# Patient Record
Sex: Female | Born: 1975 | Race: White | Hispanic: No | Marital: Married | Smoking: Current every day smoker
Health system: Southern US, Community
[De-identification: ages and names within clinical notes are randomized; demographics above are authoritative.]

## PROBLEM LIST (undated history)

## (undated) DIAGNOSIS — I10 Essential (primary) hypertension: Secondary | ICD-10-CM

## (undated) DIAGNOSIS — F319 Bipolar disorder, unspecified: Secondary | ICD-10-CM

## (undated) DIAGNOSIS — F32A Depression, unspecified: Secondary | ICD-10-CM

## (undated) DIAGNOSIS — K219 Gastro-esophageal reflux disease without esophagitis: Secondary | ICD-10-CM

## (undated) DIAGNOSIS — F329 Major depressive disorder, single episode, unspecified: Secondary | ICD-10-CM

## (undated) DIAGNOSIS — D759 Disease of blood and blood-forming organs, unspecified: Secondary | ICD-10-CM

## (undated) DIAGNOSIS — J45909 Unspecified asthma, uncomplicated: Secondary | ICD-10-CM

## (undated) HISTORY — PX: TUBAL LIGATION: SHX77

## (undated) HISTORY — PX: KNEE ARTHROSCOPY: SUR90

---

## 2006-06-03 ENCOUNTER — Other Ambulatory Visit: Admission: RE | Admit: 2006-06-03 | Discharge: 2006-06-03 | Payer: Self-pay | Admitting: Obstetrics and Gynecology

## 2007-05-13 ENCOUNTER — Other Ambulatory Visit: Admission: RE | Admit: 2007-05-13 | Discharge: 2007-05-13 | Payer: Self-pay | Admitting: Obstetrics and Gynecology

## 2007-06-04 ENCOUNTER — Encounter: Admission: RE | Admit: 2007-06-04 | Discharge: 2007-06-04 | Payer: Self-pay | Admitting: Family Medicine

## 2008-02-22 ENCOUNTER — Emergency Department (HOSPITAL_COMMUNITY): Admission: EM | Admit: 2008-02-22 | Discharge: 2008-02-22 | Payer: Self-pay | Admitting: Emergency Medicine

## 2009-09-13 ENCOUNTER — Other Ambulatory Visit: Admission: RE | Admit: 2009-09-13 | Discharge: 2009-09-13 | Payer: Self-pay | Admitting: Obstetrics and Gynecology

## 2011-03-21 LAB — POCT URINALYSIS DIP (DEVICE)
Glucose, UA: NEGATIVE
Nitrite: NEGATIVE
pH: 7

## 2011-03-21 LAB — POCT PREGNANCY, URINE: Preg Test, Ur: NEGATIVE

## 2011-04-12 ENCOUNTER — Other Ambulatory Visit (HOSPITAL_COMMUNITY): Payer: Self-pay | Admitting: Family Medicine

## 2011-04-12 ENCOUNTER — Ambulatory Visit (HOSPITAL_COMMUNITY)
Admission: RE | Admit: 2011-04-12 | Discharge: 2011-04-12 | Disposition: A | Discharge status: Home / Self Care | Payer: BC Managed Care – PPO | Source: Ambulatory Visit | Attending: Family Medicine | Admitting: Family Medicine

## 2011-04-12 DIAGNOSIS — I700 Atherosclerosis of aorta: Secondary | ICD-10-CM | POA: Insufficient documentation

## 2011-04-12 DIAGNOSIS — I517 Cardiomegaly: Secondary | ICD-10-CM | POA: Insufficient documentation

## 2011-04-12 DIAGNOSIS — R160 Hepatomegaly, not elsewhere classified: Secondary | ICD-10-CM | POA: Insufficient documentation

## 2011-04-12 DIAGNOSIS — K7689 Other specified diseases of liver: Secondary | ICD-10-CM | POA: Insufficient documentation

## 2011-04-12 MED ORDER — IOHEXOL 300 MG/ML  SOLN
100.0000 mL | Freq: Once | INTRAMUSCULAR | Status: AC | PRN
  Administered 2011-04-12: 100 mL via INTRAVENOUS

## 2011-04-13 ENCOUNTER — Other Ambulatory Visit: Payer: Self-pay | Admitting: Family Medicine

## 2011-04-13 DIAGNOSIS — R1011 Right upper quadrant pain: Secondary | ICD-10-CM

## 2011-04-16 ENCOUNTER — Ambulatory Visit
Admission: RE | Admit: 2011-04-16 | Discharge: 2011-04-16 | Disposition: A | Discharge status: Home / Self Care | Payer: BC Managed Care – PPO | Source: Ambulatory Visit | Attending: Family Medicine | Admitting: Family Medicine

## 2011-04-16 DIAGNOSIS — R1011 Right upper quadrant pain: Secondary | ICD-10-CM

## 2011-05-02 ENCOUNTER — Other Ambulatory Visit (HOSPITAL_COMMUNITY)
Admission: RE | Admit: 2011-05-02 | Discharge: 2011-05-02 | Disposition: A | Discharge status: Home / Self Care | Payer: BC Managed Care – PPO | Source: Ambulatory Visit | Attending: Obstetrics and Gynecology | Admitting: Obstetrics and Gynecology

## 2011-05-02 ENCOUNTER — Other Ambulatory Visit: Payer: Self-pay | Admitting: Obstetrics and Gynecology

## 2011-05-02 DIAGNOSIS — Z01419 Encounter for gynecological examination (general) (routine) without abnormal findings: Secondary | ICD-10-CM | POA: Insufficient documentation

## 2012-02-04 ENCOUNTER — Encounter (HOSPITAL_COMMUNITY): Payer: Self-pay | Admitting: *Deleted

## 2012-02-04 ENCOUNTER — Emergency Department (HOSPITAL_COMMUNITY)
Admission: EM | Admit: 2012-02-04 | Discharge: 2012-02-04 | Disposition: A | Discharge status: Home / Self Care | Payer: BC Managed Care – PPO | Attending: Emergency Medicine | Admitting: Emergency Medicine

## 2012-02-04 DIAGNOSIS — F17200 Nicotine dependence, unspecified, uncomplicated: Secondary | ICD-10-CM | POA: Insufficient documentation

## 2012-02-04 DIAGNOSIS — R0981 Nasal congestion: Secondary | ICD-10-CM

## 2012-02-04 DIAGNOSIS — F319 Bipolar disorder, unspecified: Secondary | ICD-10-CM | POA: Insufficient documentation

## 2012-02-04 DIAGNOSIS — I10 Essential (primary) hypertension: Secondary | ICD-10-CM | POA: Insufficient documentation

## 2012-02-04 DIAGNOSIS — J3489 Other specified disorders of nose and nasal sinuses: Secondary | ICD-10-CM | POA: Insufficient documentation

## 2012-02-04 HISTORY — DX: Essential (primary) hypertension: I10

## 2012-02-04 HISTORY — DX: Depression, unspecified: F32.A

## 2012-02-04 HISTORY — DX: Major depressive disorder, single episode, unspecified: F32.9

## 2012-02-04 HISTORY — DX: Bipolar disorder, unspecified: F31.9

## 2012-02-04 MED ORDER — AMOXICILLIN-POT CLAVULANATE 875-125 MG PO TABS
1.0000 | ORAL_TABLET | Freq: Once | ORAL | Status: AC
  Administered 2012-02-04: 1 via ORAL
  Filled 2012-02-04: qty 1

## 2012-02-04 MED ORDER — AMOXICILLIN-POT CLAVULANATE 875-125 MG PO TABS: 1.0000 | ORAL_TABLET | Freq: Two times a day (BID) | ORAL | Status: AC

## 2012-02-04 MED ORDER — HYDROCODONE-ACETAMINOPHEN 5-500 MG PO TABS: 1.0000 | ORAL_TABLET | Freq: Four times a day (QID) | ORAL | Status: AC | PRN

## 2012-02-04 MED ORDER — PSEUDOEPHEDRINE HCL ER 120 MG PO TB12: 120.0000 mg | ORAL_TABLET | Freq: Two times a day (BID) | ORAL | Status: DC

## 2012-02-04 MED ORDER — HYDROMORPHONE HCL PF 1 MG/ML IJ SOLN
1.0000 mg | Freq: Once | INTRAMUSCULAR | Status: AC
  Administered 2012-02-04: 1 mg via INTRAMUSCULAR
  Filled 2012-02-04: qty 1

## 2012-02-04 MED ORDER — PSEUDOEPHEDRINE HCL ER 120 MG PO TB12
120.0000 mg | ORAL_TABLET | Freq: Two times a day (BID) | ORAL | Status: DC
  Administered 2012-02-04: 120 mg via ORAL
  Filled 2012-02-04: qty 1

## 2012-02-04 MED ORDER — ONDANSETRON 4 MG PO TBDP
4.0000 mg | ORAL_TABLET | Freq: Once | ORAL | Status: AC
  Administered 2012-02-04: 4 mg via ORAL
  Filled 2012-02-04: qty 1

## 2012-02-04 NOTE — ED Notes (Signed)
Patient presents with c/o headache.  She started with a headache approx 4 days ago and thought it was a sinus headache.  She took Nyquil, Motrin, Mucinex but it seems to have gotten worse.  States the top of her head hurts as well as her ears and cheeks.  Is not moving her head not due to her neck being stiff but her head hurts so bad.  Has never had a headache like this one.  Sitting in the chair, husband was answering most of the questions.

## 2012-02-04 NOTE — ED Notes (Signed)
Patient is resting comfortably; no respiratory or acute distress noted.  Patient states that pain has improved; states, "I feel much better".  Updated patient on plan of care; informed patient that we are currently waiting on discharge paperwork from EDP/FNP.  Patient has no other questions or concerns at this time; will continue to monitor.

## 2012-02-04 NOTE — ED Provider Notes (Signed)
History     CSN: [id number]  Arrival date & time 02/04/12  0310   First MD Initiated Contact with Patient 02/04/12 [phone]      Chief Complaint  Patient presents with  . Headache    (Consider location/radiation/quality/duration/timing/severity/associated sxs/prior treatment) HPI Comments: Patient has had frontal headache with congestion for the last 3, days.  She's tried multiple over-the-counter medications without any relief.  She has a history of sinus infections.  She has not seen her primary care doctor for this episode  Patient is a 36 y.o. female presenting with headaches. The history is provided by the patient.  Headache  This is a new problem. The problem occurs constantly. The problem has been gradually worsening. Pertinent negatives include no fever and no nausea.    Past Medical History  Diagnosis Date  . Hypertension   . Depression   . Bipolar 1 disorder     Past Surgical History  Procedure Date  . Cesarean section     History reviewed. No pertinent family history.  History  Substance Use Topics  . Smoking status: Current Everyday Smoker  . Smokeless tobacco: Not on file  . Alcohol Use: Yes    OB History    Grav Para Term Preterm Abortions TAB SAB Ect Mult Living                  Review of Systems  Constitutional: Negative for fever and chills.  HENT: Positive for sinus pressure. Negative for congestion, sore throat, neck stiffness, dental problem and ear discharge.   Gastrointestinal: Negative for nausea.  Musculoskeletal: Negative for myalgias and joint swelling.  Skin: Negative for wound.  Neurological: Positive for headaches. Negative for dizziness and weakness.    Allergies  Review of patient's allergies indicates no known allergies.  Home Medications   Current Outpatient Rx  Name Route Sig Dispense Refill  . AMPHETAMINE-DEXTROAMPHETAMINE 20 MG PO TABS Oral Take 20 mg by mouth 3 (three) times daily.    . ATENOLOL-CHLORTHALIDONE 50-25 MG  PO TABS Oral Take 1 tablet by mouth daily.    . DESVENLAFAXINE SUCCINATE ER 50 MG PO TB24 Oral Take 50 mg by mouth daily.    Marland Kitchen ESOMEPRAZOLE MAGNESIUM 40 MG PO CPDR Oral Take 40 mg by mouth daily before breakfast.    . IBUPROFEN 200 MG PO TABS Oral Take 600 mg by mouth every 6 (six) hours as needed. For pain    . NYQUIL PO Oral Take 2 capsules by mouth at bedtime as needed. For pain    . ZIPRASIDONE HCL 40 MG PO CAPS Oral Take 40 mg by mouth daily.    . AMOXICILLIN-POT CLAVULANATE 875-125 MG PO TABS Oral Take 1 tablet by mouth 2 (two) times daily. 20 tablet 0  . HYDROCODONE-ACETAMINOPHEN 5-500 MG PO TABS Oral Take 1-2 tablets by mouth every 6 (six) hours as needed for pain. 15 tablet 0  . PSEUDOEPHEDRINE HCL ER 120 MG PO TB12 Oral Take 1 tablet (120 mg total) by mouth every 12 (twelve) hours. 20 tablet 0    BP 130/85  Pulse 79  Temp 98.2 F (36.8 C) (Oral)  Resp 16  SpO2 98%  Physical Exam  Constitutional: She is oriented to person, place, and time. She appears well-developed and well-nourished.  HENT:  Head: Normocephalic.  Eyes: Pupils are equal, round, and reactive to light. Right eye exhibits normal extraocular motion and no nystagmus. Left eye exhibits normal extraocular motion and no nystagmus. Right pupil is round and  reactive. Left pupil is round and reactive. Pupils are equal.  Cardiovascular: Normal rate.   Pulmonary/Chest: Effort normal.  Abdominal: Soft.  Musculoskeletal: Normal range of motion.  Neurological: She is alert and oriented to person, place, and time.  Skin: Skin is warm and dry.    ED Course  Procedures (including critical care time)  Labs Reviewed - No data to display No results found.   1. Sinus congestion       MDM  Patient was given IM, Dilaudid for pain, as well as pseudoephedrine , and Augmentin.  Patient is feeling, better, since she's been medicated.  I will send her home with prescription for pseudoephedrine as well as Augmentin and  short-term use of, and Percocet        Arman Filter, NP 02/04/12 0557  Arman Filter, NP 02/04/12 [phone]

## 2012-02-04 NOTE — ED Notes (Signed)
Patient given discharge paperwork; went over discharge instructions with patient.  Patient instructed to follow up with referral/primary care physician, take prescriptions as directed, to finish entire antibiotic prescription, to not drive/drink while taking narcotics, and to return to the ED for new, worsening, or concerning symptoms.

## 2012-02-04 NOTE — ED Provider Notes (Signed)
Medical screening examination/treatment/procedure(s) were performed by non-physician practitioner and as supervising physician I was immediately available for consultation/collaboration.  Vikki Gains Smitty Cords, MD 02/04/12 0600

## 2012-02-04 NOTE — ED Notes (Signed)
Received bedside report from [place], [place].  Patient currently resting quietly in bed; no respiratory or acute distress noted.  Patient updated on plan of care; informed patient that we are currently waiting on further orders from EDP/FNP.  Patient has no other questions or concerns at this time.  Will continue to monitor.

## 2012-02-04 NOTE — ED Notes (Signed)
Pt states she feels like she has a sinus infection. Pt states pressure HA, denies hx of migraines. Pt neuro deficits. Pt ambulatory. Pt states generalize HA

## 2012-06-19 ENCOUNTER — Other Ambulatory Visit (HOSPITAL_COMMUNITY)
Admission: RE | Admit: 2012-06-19 | Discharge: 2012-06-19 | Disposition: A | Discharge status: Home / Self Care | Payer: BC Managed Care – PPO | Source: Ambulatory Visit | Attending: Obstetrics and Gynecology | Admitting: Obstetrics and Gynecology

## 2012-06-19 ENCOUNTER — Other Ambulatory Visit: Payer: Self-pay | Admitting: Obstetrics and Gynecology

## 2012-06-19 DIAGNOSIS — Z1151 Encounter for screening for human papillomavirus (HPV): Secondary | ICD-10-CM | POA: Insufficient documentation

## 2012-06-19 DIAGNOSIS — Z01419 Encounter for gynecological examination (general) (routine) without abnormal findings: Secondary | ICD-10-CM | POA: Insufficient documentation

## 2012-07-21 ENCOUNTER — Other Ambulatory Visit: Payer: Self-pay | Admitting: Obstetrics and Gynecology

## 2012-08-27 ENCOUNTER — Other Ambulatory Visit: Payer: Self-pay | Admitting: Obstetrics and Gynecology

## 2012-09-02 ENCOUNTER — Encounter (HOSPITAL_COMMUNITY): Payer: Self-pay | Admitting: Pharmacist

## 2012-09-07 ENCOUNTER — Other Ambulatory Visit: Payer: Self-pay | Admitting: Obstetrics and Gynecology

## 2012-09-07 NOTE — H&P (Signed)
08/18/2012  History of Present Illness  General:  37 y/o G6P4 with irregular bleeding presents to discuss future management, possible surgery. S/p pelvic ultrasound, which showed a small submucosal fibroid. EMS thickened. EMB benign. Declines medical management. Declines Mirena. Desires surgical intervention. Does have time at work for an extended recovery. Would like to do it sooner than later. Does not want to have a hysterectomy at this time due to possibility of extended recovery. Recent UTI, symptoms resolved s/p antibiotics. Occ Right groin pain when starting to stand up from a seated position.   Current Medications  Taking ProAir HFA 90MC[employer] Aerosol Solution INHALE 1 TO 2 PUFFS EVERY 4 HOURS AS NEEDED FOR WHEEZING as needed  Taking Multivitamins Tablet 1 tablet once a day  Taking Vitamin B-6 ? Tablet 1 tablet Once a day  Taking Vitamin B-12 CR ? Tablet Extended Release 1 tablet Once a day  Taking Fish Oil 1000 MG Capsule 1 capsule with a meal Once a day  Taking Flax Seed Oil 1000 MG Capsule 1 capsule once a day  Taking Claritin 10 MG Tablet 1 tablet as needed  Taking Atenolol-Chlorthalidone 50/25MG Tablet TAKE 1 TABLET DAILY FOR BLOOD PRESSURE   Taking Nexium 40MG Capsule Delayed Release 1 capsule Once a day  Taking Adderall 20 MG Tablet 1 tablet Once a day  Taking Seroquel 400 MG Tablet 1 tablet at bedtime Once a day  Taking Vitamin D   Taking Cipro 250 MG Tablet 1 tablet Twice a day  Taking Latuda 20 MG Tablet 1 tablet with food Once a day  Medication List reviewed and reconciled with the patient   Past Medical History  Hypertension (2011)  Asthma  GERD  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 Kaur, 12/2011)   Surgical History  Knee surgery   C section   Tubal ligation   Cervical cone due to dysplasia    Family History  Father: COPD  Mother: HTN, GERD,  PUD, valvular heart disease, Breast cancer, endometrial cancer 2013  Paternal Grand Mother: Diabetes  Maternal Grand Father: Colon cancer  Paternal aunt: Lung cancer, hemochromatosis  3 cousins with hemachromatosis, great paternal aunt breast cancer, paternal aunt aneurysm Neg GI family hx of polyps and liver diease.   Social History  General:  History of smoking  cigarettes: Current smoker Frequency: 1 PPD Smoking: yes, 1 PPD.  Alcohol: yes, occasionally wine.  Caffeine: coffee, ,un sweet tea.  no Recreational drug use.  Occupation: [employer].  Marital Status: married, Scott.  Children: 4.  Seat belt use: yes.    Gyn History  Sexual activity currently sexually active.  Periods : since October every 2 weeks.  LMP 08/01/12.  Birth control BTL.  Last pap smear date 06/19/12, all negative.  Denies H/O Last mammogram date.  Abnormal pap smear treated with LEEP.  Denies H/O STD.    OB History  Number of pregnancies 6.  Pregnancy # 1 live birth, vaginal delivery.  Pregnancy # 2 live birth, vaginal delivery.  Pregnancy # 3 abortion.  Pregnancy # 4: live birth, C-section.  Pregnancy # 5: Abortion.  Pregnancy # 6 live birth, C-section.    Allergies  Metformin HCl: GI side effects  pollen   Hospitalization/Major Diagnostic Procedure  see surgery   Not in the past year 05/2011   Vital Signs  Wt 214, Wt change 3 lb, Ht 65.5, BMI   35.07, Temp 97.6, Pulse sitting 83, BP sitting 117/78.   Physical Examination  GENERAL:  Patient appears in NAD, pleasant.  Build: well developed.  General Appearance: overweight.  Race: caucasian.  NECK:  Cervical lymph nodes: unremarkable, no lymphadenopathy.  ROM: normal.  Thyroid: no thyromegaly, non tender.  LUNGS:  Breath sounds: clear to auscultation.  Dyspnea: no.  HEART:  Murmurs: none.  Rate: normal.  Rhythm: regular.  ABDOMEN:  General: no masses,tenderness,organomegaly, no CVAT.  FEMALE GENITOURINARY:   Adnexa: no mass, non tender.  Anus/perineum: normal, no lesions.  Cervix/ cuff: normal appearance , no lesions/discharge/bleeding, good pelvic support.  External genitalia: normal, no lesions, no skin discoloration, no lymphadenopathy.  Rectum: deferred.  Urethra: normal external meatus.  Uterus: normal size/shape/consistency, freely mobile, non tender.  Vagina: pink/moist mucosa, no lesions, no abnormal discharge, odorless.  Vulva: normal, no lesions, no skin discoloration, non tender.  EXTREMITIES:  Extremities FROM of all extremities.  NEUROLOGICAL:  Gait: normal.  Orientation: alert and oriented x 3.     Assessments   1. Irregular menses - 626.4 (Primary)   2. 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 Irregular menses  Notes: Reviewed in detail the recommended surgical procedures-hysteroscopy, resection of fibroid with Trueclear and HTA endometrial ablation. Pt informed that bleeding may resume and that most definitive option is hysterectomy. R/B/A reviewed. All questions answered.  Referral To:[facility]necology Reason:Precert and schedule for HTA ablation, True Clear resection of fibroid    Follow Up  2 Weeks post op    

## 2012-09-11 ENCOUNTER — Encounter (HOSPITAL_COMMUNITY)
Admission: RE | Admit: 2012-09-11 | Discharge: 2012-09-11 | Disposition: A | Discharge status: Home / Self Care | Payer: BC Managed Care – PPO | Source: Ambulatory Visit | Attending: Obstetrics and Gynecology | Admitting: Obstetrics and Gynecology

## 2012-09-11 ENCOUNTER — Encounter (HOSPITAL_COMMUNITY): Payer: Self-pay

## 2012-09-11 HISTORY — DX: Unspecified asthma, uncomplicated: J45.909

## 2012-09-11 HISTORY — DX: Disease of blood and blood-forming organs, unspecified: D75.9

## 2012-09-11 HISTORY — DX: Gastro-esophageal reflux disease without esophagitis: K21.9

## 2012-09-11 LAB — BASIC METABOLIC PANEL
Calcium: 9.2 mg/dL (ref 8.4–10.5)
Creatinine, Ser: 0.59 mg/dL (ref 0.50–1.10)
GFR calc Af Amer: >90 mL/min (ref >90)
GFR calc non Af Amer: >90 mL/min (ref >90)
Sodium: 136 mEq/L (ref 135–145)

## 2012-09-11 LAB — CBC
Platelets: 295 10*3/uL (ref 150–400)
RBC: 4.65 MIL/uL (ref 3.87–5.11)
RDW: 13.9 % (ref 11.5–15.5)
WBC: 9.9 10*3/uL (ref 4.0–10.5)

## 2012-09-11 NOTE — Patient Instructions (Signed)
Your procedure is scheduled on:09/16/12  Enter through the Main Entrance at :6am Pick up desk phone and dial [phone] and inform us of your arrival.  Please call [phone] if you have any problems the morning of surgery.  Remember: Do not eat or drink after midnight:Monday   DO NOT wear jewelry, eye make-up, lipstick,body lotion, or dark fingernail polish.   If you are to be admitted after surgery, leave suitcase in car until your room has been assigned. Patients discharged on the day of surgery will not be allowed to drive home.

## 2012-09-12 IMAGING — US US ABDOMEN COMPLETE
1 series · 14 of 25 positions shown · non-contrast
Comparison: 04/12/2011

CLINICAL DATA: Right upper quadrant pain

ABDOMINAL ULTRASOUND COMPLETE

[Series 1: us abdomen complete · 0.22mm/px · 14 of 70 slices shown]
[im 1/70]
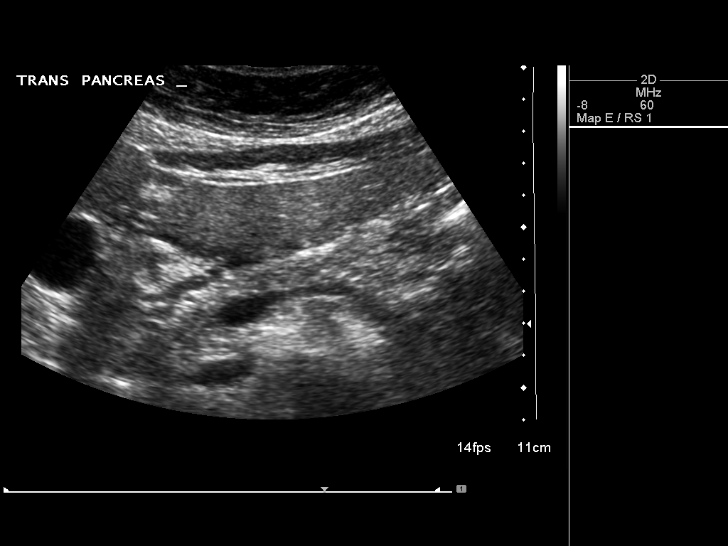
[im 6/70]
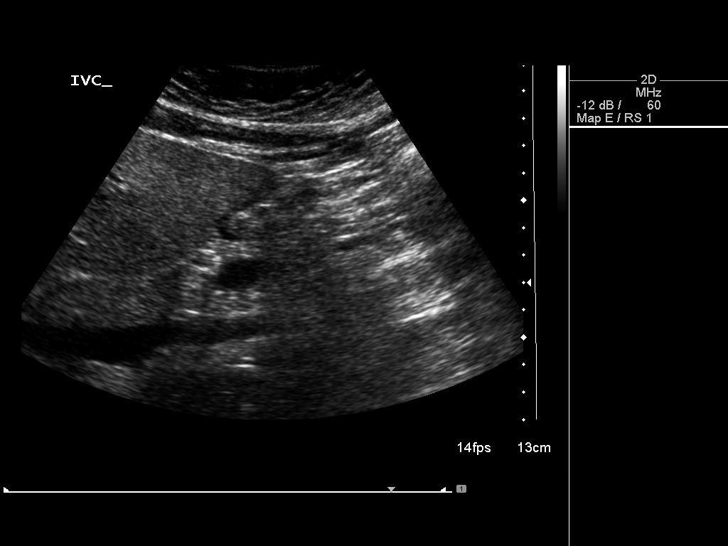
[im 12/70]
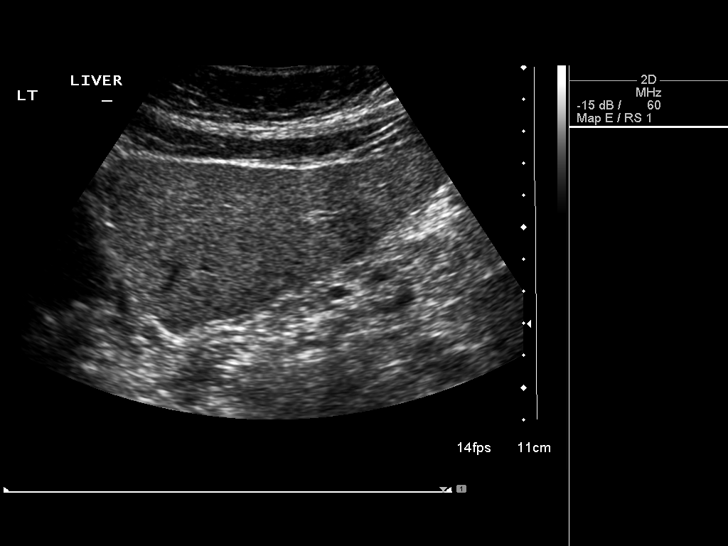
[im 18/70]
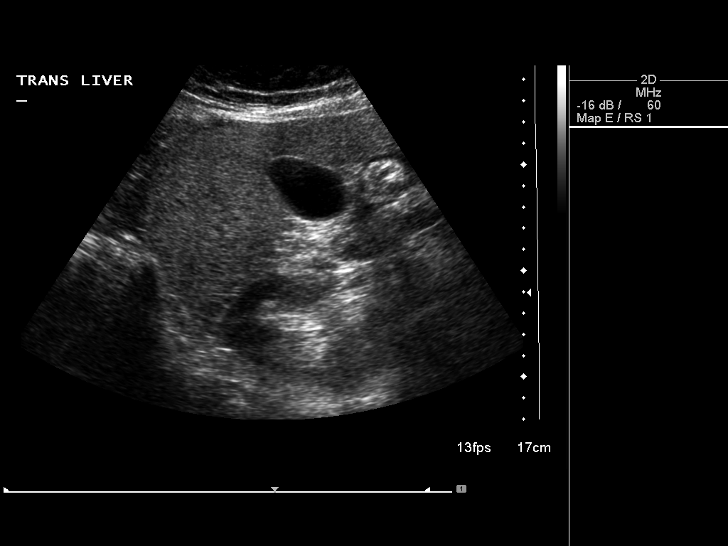
[im 24/70]
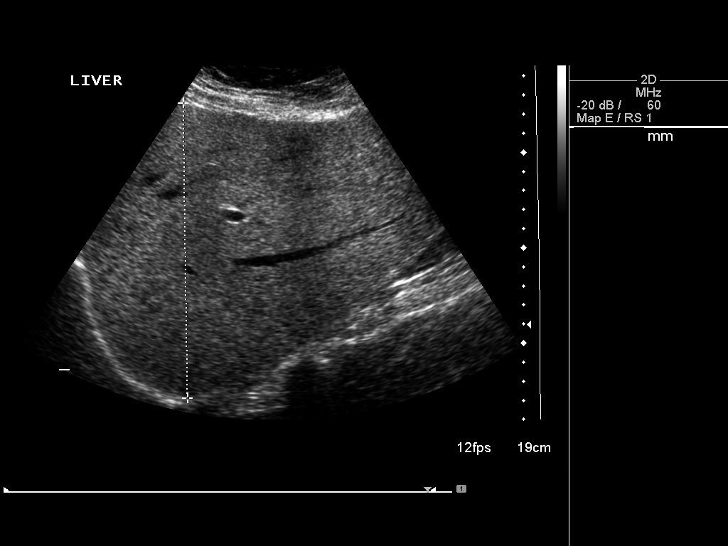
[im 26/70]
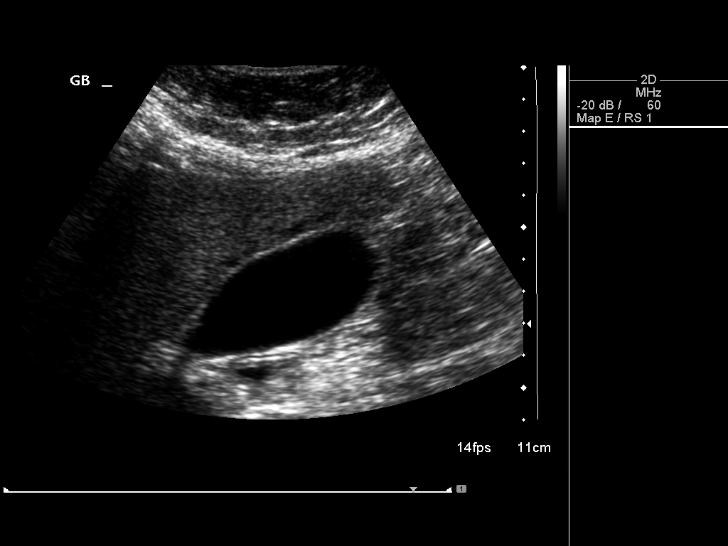
[im 32/70]
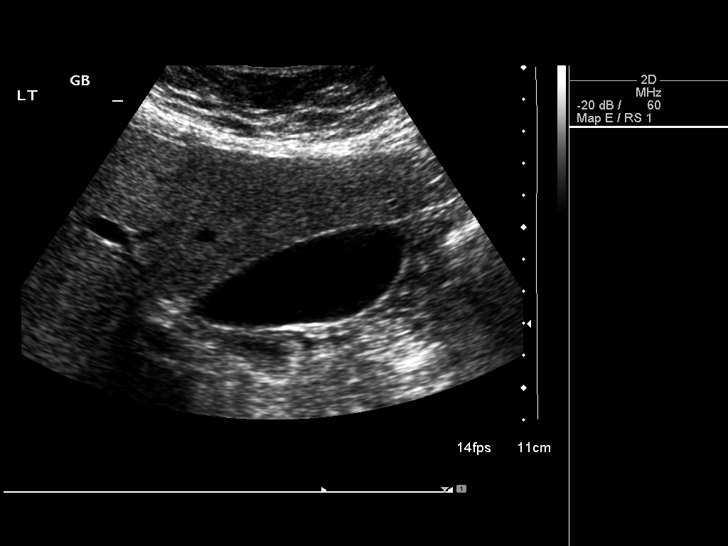
[im 38/70]
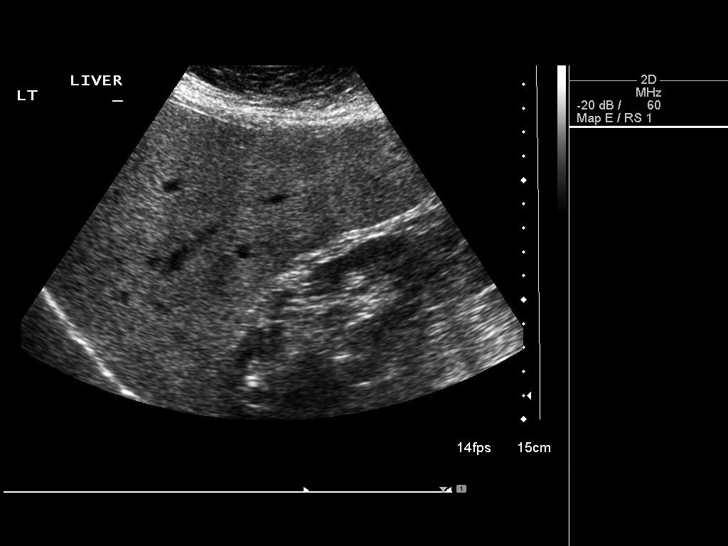
[im 44/70]
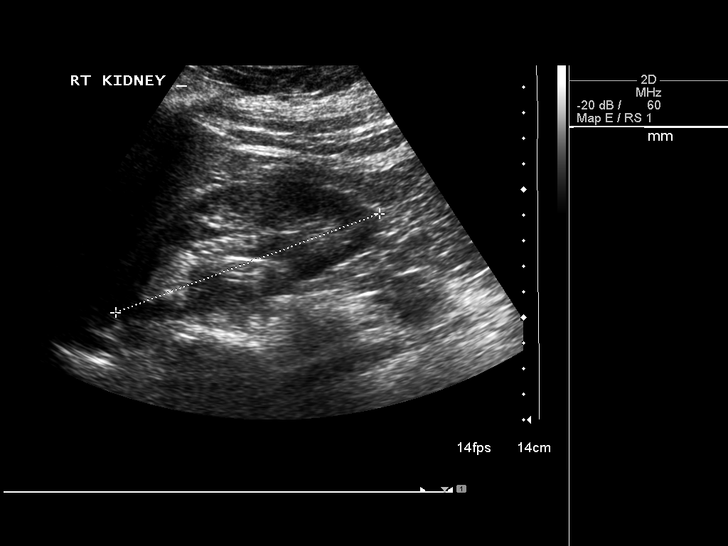
[im 47/70]
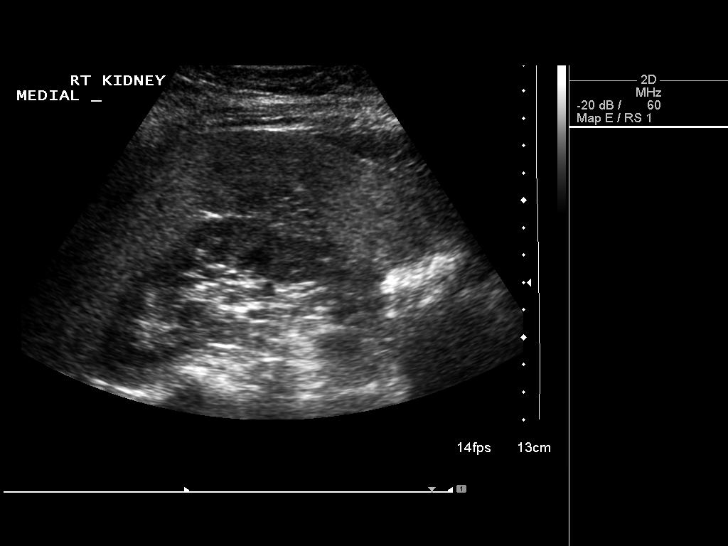
[im 52/70]
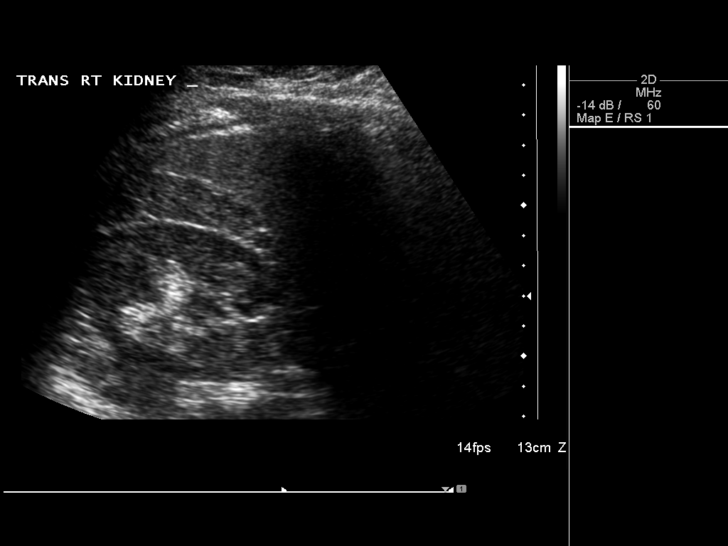
[im 58/70]
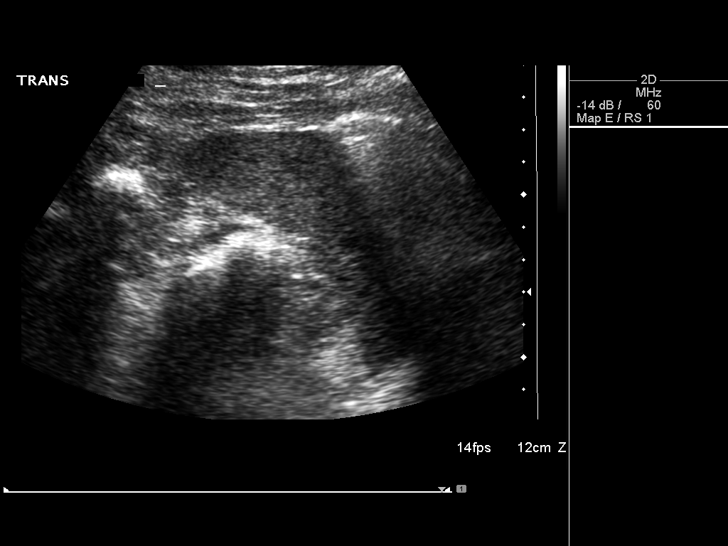
[im 64/70]
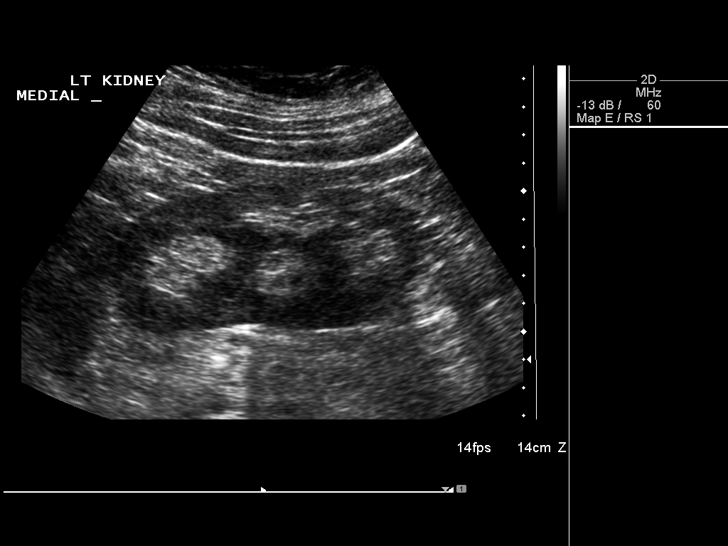
[im 70/70]
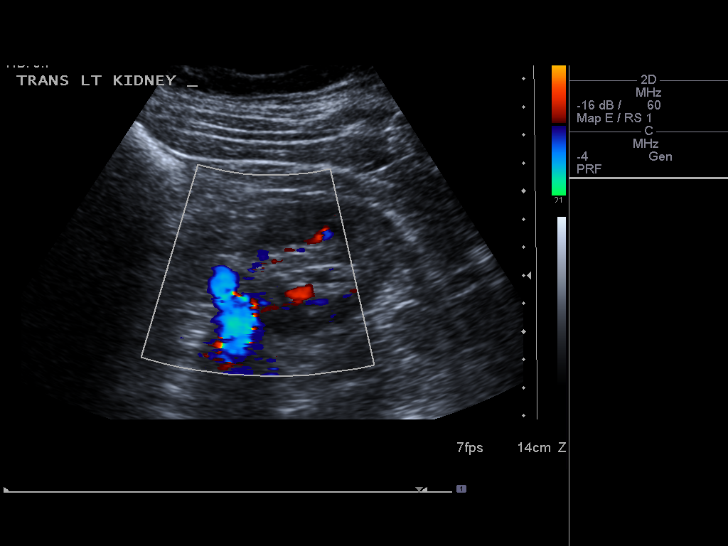

[14 of 25 positions shown; findings below may reference images not displayed]

FINDINGS: Gallbladder:  No gallstones, gallbladder wall thickening, or
pericholecystic fluid.

Common Bile Duct:  Within normal limits in caliber.

Liver: No focal mass lesion identified.  Within normal limits in
parenchymal echogenicity.

IVC:  Appears normal.

Pancreas:  No abnormality identified.

Spleen:  Within normal limits in size and echotexture.

Right kidney:  Normal in size and parenchymal echogenicity.  No
evidence of mass or hydronephrosis.

Left kidney:  Normal in size and parenchymal echogenicity.  No
evidence of mass or hydronephrosis.

Abdominal Aorta:  No aneurysm identified.
IMPRESSION: Negative abdominal ultrasound.

## 2012-09-16 ENCOUNTER — Ambulatory Visit (HOSPITAL_COMMUNITY)
Admission: RE | Admit: 2012-09-16 | Discharge: 2012-09-16 | Disposition: A | Discharge status: Home / Self Care | Payer: BC Managed Care – PPO | Source: Ambulatory Visit | Attending: Obstetrics and Gynecology | Admitting: Obstetrics and Gynecology

## 2012-09-16 ENCOUNTER — Encounter (HOSPITAL_COMMUNITY)
Admission: RE | Disposition: A | Discharge status: Home / Self Care | Payer: Self-pay | Source: Ambulatory Visit | Attending: Obstetrics and Gynecology

## 2012-09-16 ENCOUNTER — Encounter (HOSPITAL_COMMUNITY): Payer: Self-pay | Admitting: Anesthesiology

## 2012-09-16 ENCOUNTER — Ambulatory Visit (HOSPITAL_COMMUNITY): Payer: BC Managed Care – PPO | Admitting: Anesthesiology

## 2012-09-16 ENCOUNTER — Other Ambulatory Visit: Payer: Self-pay | Admitting: Obstetrics and Gynecology

## 2012-09-16 DIAGNOSIS — I10 Essential (primary) hypertension: Secondary | ICD-10-CM | POA: Insufficient documentation

## 2012-09-16 DIAGNOSIS — K219 Gastro-esophageal reflux disease without esophagitis: Secondary | ICD-10-CM | POA: Insufficient documentation

## 2012-09-16 DIAGNOSIS — N938 Other specified abnormal uterine and vaginal bleeding: Secondary | ICD-10-CM | POA: Insufficient documentation

## 2012-09-16 DIAGNOSIS — D250 Submucous leiomyoma of uterus: Secondary | ICD-10-CM | POA: Insufficient documentation

## 2012-09-16 DIAGNOSIS — Z9889 Other specified postprocedural states: Secondary | ICD-10-CM

## 2012-09-16 DIAGNOSIS — N926 Irregular menstruation, unspecified: Secondary | ICD-10-CM | POA: Insufficient documentation

## 2012-09-16 DIAGNOSIS — N949 Unspecified condition associated with female genital organs and menstrual cycle: Secondary | ICD-10-CM | POA: Insufficient documentation

## 2012-09-16 HISTORY — PX: DILITATION & CURRETTAGE/HYSTROSCOPY WITH HYDROTHERMAL ABLATION: SHX5570

## 2012-09-16 LAB — TYPE AND SCREEN: ABO/RH(D): A POS

## 2012-09-16 LAB — BASIC METABOLIC PANEL
Calcium: 9 mg/dL (ref 8.4–10.5)
Creatinine, Ser: 0.67 mg/dL (ref 0.50–1.10)
GFR calc non Af Amer: >90 mL/min (ref >90)
Glucose, Bld: 111 mg/dL — ABNORMAL HIGH (ref 70–99)
Sodium: 136 mEq/L (ref 135–145)

## 2012-09-16 LAB — ABO/RH: ABO/RH(D): A POS

## 2012-09-16 SURGERY — DILATATION & CURETTAGE/HYSTEROSCOPY WITH TRUCLEAR
Anesthesia: General | Site: Vagina | Wound class: Clean Contaminated

## 2012-09-16 MED ORDER — LIDOCAINE HCL (CARDIAC) 20 MG/ML IV SOLN
INTRAVENOUS | Status: AC
  Filled 2012-09-16: qty 5

## 2012-09-16 MED ORDER — PROPOFOL 10 MG/ML IV EMUL
INTRAVENOUS | Status: AC
  Filled 2012-09-16: qty 20

## 2012-09-16 MED ORDER — FENTANYL CITRATE 0.05 MG/ML IJ SOLN
INTRAMUSCULAR | Status: DC | PRN
  Administered 2012-09-16: 25 ug via INTRAVENOUS
  Administered 2012-09-16 (×3): 50 ug via INTRAVENOUS
  Administered 2012-09-16: 25 ug via INTRAVENOUS

## 2012-09-16 MED ORDER — CEFAZOLIN SODIUM-DEXTROSE 2-3 GM-% IV SOLR: 2.0000 g | INTRAVENOUS | Status: DC

## 2012-09-16 MED ORDER — OXYCODONE-ACETAMINOPHEN 5-325 MG PO TABS: 1.0000 | ORAL_TABLET | ORAL | Status: AC | PRN

## 2012-09-16 MED ORDER — PROPOFOL 10 MG/ML IV EMUL
INTRAVENOUS | Status: DC | PRN
  Administered 2012-09-16: 250 mg via INTRAVENOUS

## 2012-09-16 MED ORDER — KETOROLAC TROMETHAMINE 30 MG/ML IJ SOLN
INTRAMUSCULAR | Status: DC | PRN
  Administered 2012-09-16: 30 mg via INTRAVENOUS

## 2012-09-16 MED ORDER — LACTATED RINGERS IV SOLN
INTRAVENOUS | Status: DC
  Administered 2012-09-16 (×2): via INTRAVENOUS

## 2012-09-16 MED ORDER — EPHEDRINE 5 MG/ML INJ
INTRAVENOUS | Status: AC
  Filled 2012-09-16: qty 10

## 2012-09-16 MED ORDER — BUPIVACAINE HCL (PF) 0.25 % IJ SOLN
INTRAMUSCULAR | Status: DC | PRN
  Administered 2012-09-16: 10 mL

## 2012-09-16 MED ORDER — ONDANSETRON HCL 4 MG/2ML IJ SOLN
INTRAMUSCULAR | Status: DC | PRN
  Administered 2012-09-16: 4 mg via INTRAVENOUS

## 2012-09-16 MED ORDER — GLYCOPYRROLATE 0.2 MG/ML IJ SOLN
INTRAMUSCULAR | Status: AC
  Filled 2012-09-16: qty 1

## 2012-09-16 MED ORDER — DEXAMETHASONE SODIUM PHOSPHATE 10 MG/ML IJ SOLN
INTRAMUSCULAR | Status: AC
  Filled 2012-09-16: qty 1

## 2012-09-16 MED ORDER — FENTANYL CITRATE 0.05 MG/ML IJ SOLN
INTRAMUSCULAR | Status: AC
  Filled 2012-09-16: qty 2

## 2012-09-16 MED ORDER — SODIUM CHLORIDE 0.9 % IR SOLN
Status: DC | PRN
  Administered 2012-09-16: 3000 mL

## 2012-09-16 MED ORDER — BUPIVACAINE HCL (PF) 0.25 % IJ SOLN
INTRAMUSCULAR | Status: AC
  Filled 2012-09-16: qty 30

## 2012-09-16 MED ORDER — LIDOCAINE HCL (CARDIAC) 20 MG/ML IV SOLN
INTRAVENOUS | Status: DC | PRN
  Administered 2012-09-16: 50 mg via INTRAVENOUS

## 2012-09-16 MED ORDER — IBUPROFEN 600 MG PO TABS: 600.0000 mg | ORAL_TABLET | Freq: Four times a day (QID) | ORAL | Status: AC | PRN

## 2012-09-16 MED ORDER — ONDANSETRON HCL 4 MG/2ML IJ SOLN
INTRAMUSCULAR | Status: AC
  Filled 2012-09-16: qty 2

## 2012-09-16 MED ORDER — OXYCODONE-ACETAMINOPHEN 5-325 MG PO TABS
ORAL_TABLET | ORAL | Status: AC
  Administered 2012-09-16: 1
  Filled 2012-09-16: qty 1

## 2012-09-16 MED ORDER — MIDAZOLAM HCL 2 MG/2ML IJ SOLN
INTRAMUSCULAR | Status: AC
  Filled 2012-09-16: qty 2

## 2012-09-16 MED ORDER — FENTANYL CITRATE 0.05 MG/ML IJ SOLN
25.0000 ug | INTRAMUSCULAR | Status: DC | PRN
  Administered 2012-09-16 (×2): 50 ug via INTRAVENOUS

## 2012-09-16 MED ORDER — GLYCOPYRROLATE 0.2 MG/ML IJ SOLN
INTRAMUSCULAR | Status: DC | PRN
  Administered 2012-09-16 (×2): 0.1 mg via INTRAVENOUS

## 2012-09-16 MED ORDER — CEFAZOLIN SODIUM-DEXTROSE 2-3 GM-% IV SOLR
INTRAVENOUS | Status: AC
  Administered 2012-09-16: 2 g via INTRAVENOUS
  Filled 2012-09-16: qty 50

## 2012-09-16 MED ORDER — FENTANYL CITRATE 0.05 MG/ML IJ SOLN
INTRAMUSCULAR | Status: AC
  Filled 2012-09-16: qty 5

## 2012-09-16 MED ORDER — MIDAZOLAM HCL 5 MG/5ML IJ SOLN
INTRAMUSCULAR | Status: DC | PRN
  Administered 2012-09-16: 1 mg via INTRAVENOUS

## 2012-09-16 MED ORDER — EPHEDRINE SULFATE 50 MG/ML IJ SOLN
INTRAMUSCULAR | Status: DC | PRN
  Administered 2012-09-16: 10 mg via INTRAVENOUS
  Administered 2012-09-16: 5 mg via INTRAVENOUS

## 2012-09-16 MED ORDER — KETOROLAC TROMETHAMINE 30 MG/ML IJ SOLN
INTRAMUSCULAR | Status: AC
  Filled 2012-09-16: qty 1

## 2012-09-16 MED ORDER — DEXAMETHASONE SODIUM PHOSPHATE 4 MG/ML IJ SOLN
INTRAMUSCULAR | Status: DC | PRN
  Administered 2012-09-16: 8 mg via INTRAVENOUS

## 2012-09-16 SURGICAL SUPPLY — 25 items
BLADE INCISOR TRUC PLUS 2.9 (ABLATOR) IMPLANT
CANISTERS HI-FLOW 3000CC (CANNISTER) ×2 IMPLANT
CATH ROBINSON RED A/P 16FR (CATHETERS) ×2 IMPLANT
CLOTH BEACON ORANGE TIMEOUT ST (SAFETY) ×2 IMPLANT
CONTAINER PREFILL 10% NBF 60ML (FORM) ×4 IMPLANT
DRAPE HYSTEROSCOPY (DRAPE) ×3 IMPLANT
DRESSING TELFA 8X3 (GAUZE/BANDAGES/DRESSINGS) ×3 IMPLANT
ELECT REM PT RETURN 9FT ADLT (ELECTROSURGICAL) ×2
ELECTRODE REM PT RTRN 9FT ADLT (ELECTROSURGICAL) ×1 IMPLANT
GLOVE BIO SURGEON STRL SZ7 (GLOVE) ×3 IMPLANT
GLOVE BIOGEL PI IND STRL 7.0 (GLOVE) ×1 IMPLANT
GLOVE BIOGEL PI INDICATOR 7.0 (GLOVE) ×2
GOWN PREVENTION PLUS XLARGE (GOWN DISPOSABLE) ×2 IMPLANT
GOWN STRL REIN XL XLG (GOWN DISPOSABLE) ×5 IMPLANT
INCISOR TRUC PLUS BLADE 2.9 (ABLATOR)
KIT HYSTEROSCOPY TRUCLEAR (ABLATOR) ×2 IMPLANT
MORCELLATOR RECIP TRUCLEAR 4.0 (ABLATOR) ×1 IMPLANT
NDL SPNL 22GX3.5 QUINCKE BK (NEEDLE) ×1 IMPLANT
NEEDLE SPNL 22GX3.5 QUINCKE BK (NEEDLE) ×4 IMPLANT
PACK VAGINAL MINOR WOMEN LF (CUSTOM PROCEDURE TRAY) ×3 IMPLANT
PAD OB MATERNITY 4.3X12.25 (PERSONAL CARE ITEMS) ×2 IMPLANT
SET GENESYS HTA PROCERVA (MISCELLANEOUS) ×1 IMPLANT
SYR CONTROL 10ML LL (SYRINGE) ×3 IMPLANT
TOWEL OR 17X24 6PK STRL BLUE (TOWEL DISPOSABLE) ×6 IMPLANT
WATER STERILE IRR 1000ML POUR (IV SOLUTION) ×2 IMPLANT

## 2012-09-16 NOTE — H&P (View-Only) (Signed)
08/18/2012  History of Present Illness  General:  37 y/o G6P4 with irregular bleeding presents to discuss future management, possible surgery. S/p pelvic ultrasound, which showed a small submucosal fibroid. EMS thickened. EMB benign. Declines medical management. Declines Mirena. Desires surgical intervention. Does have time at work for an extended recovery. Would like to do it sooner than later. Does not want to have a hysterectomy at this time due to possibility of extended recovery. Recent UTI, symptoms resolved s/p antibiotics. Occ Right groin pain when starting to stand up from a seated position.   Current Medications  Taking ProAir HFA Aerosol Solution INHALE 1 TO 2 PUFFS EVERY 4 HOURS AS NEEDED FOR WHEEZING as needed  Taking Multivitamins Tablet 1 tablet once a day  Taking Vitamin B-6 ? Tablet 1 tablet Once a day  Taking Vitamin B-12 CR ? Tablet Extended Release 1 tablet Once a day  Taking Fish Oil 1000 MG Capsule 1 capsule with a meal Once a day  Taking Flax Seed Oil 1000 MG Capsule 1 capsule once a day  Taking Claritin 10 MG Tablet 1 tablet as needed  Taking Atenolol-Chlorthalidone 50/25MG  Tablet TAKE 1 TABLET DAILY FOR BLOOD PRESSURE   Taking Nexium 40MG  Capsule Delayed Release 1 capsule Once a day  Taking Adderall 20 MG Tablet 1 tablet Once a day  Taking Seroquel 400 MG Tablet 1 tablet at bedtime Once a day  Taking Vitamin D   Taking Cipro 250 MG Tablet 1 tablet Twice a day  Taking Latuda 20 MG Tablet 1 tablet with food Once a day  Medication List reviewed and reconciled with the patient   Past Medical History  Hypertension (2011)  Asthma  GERD  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 Evelene Croon, 12/2011)   Surgical History  Knee surgery   C section   Tubal ligation   Cervical cone due to dysplasia    Family History  Father: COPD  Mother: HTN, GERD,  PUD, valvular heart disease, Breast cancer, endometrial cancer 2013  Paternal Grand Mother: Diabetes  Maternal Grand Father: Colon cancer  Paternal aunt: Lung cancer, hemochromatosis  3 cousins with hemachromatosis, great paternal aunt breast cancer, paternal aunt aneurysm Neg GI family hx of polyps and liver diease.   Social History  General:  History of smoking  cigarettes: Current smoker Frequency: 1 PPD Smoking: yes, 1 PPD.  Alcohol: yes, occasionally wine.  Caffeine: coffee, ,un sweet tea.  no Recreational drug use.  Occupation: Financial risk analyst.  Marital Status: married, Acupuncturist.  Children: 4.  Seat belt use: yes.    Gyn History  Sexual activity currently sexually active.  Periods : since October every 2 weeks.  LMP 08/01/12.  Birth control BTL.  Last pap smear date 06/19/12, all negative.  Denies H/O Last mammogram date.  Abnormal pap smear treated with LEEP.  Denies H/O STD.    OB History  Number of pregnancies 6.  Pregnancy # 1 live birth, vaginal delivery.  Pregnancy # 2 live birth, vaginal delivery.  Pregnancy # 3 abortion.  Pregnancy # 4: live birth, C-section.  Pregnancy # 5: Abortion.  Pregnancy # 6 live birth, C-section.    Allergies  Metformin HCl: GI side effects  pollen   Hospitalization/Major Diagnostic Procedure  see surgery   Not in the past year 05/2011   Vital Signs  Wt 214, Wt change 3 lb, Ht 65.5, BMI  35.07, Temp 97.6, Pulse sitting 83, BP sitting 117/78.   Physical Examination  GENERAL:  Patient appears in NAD, pleasant.  Build: well developed.  General Appearance: overweight.  Race: caucasian.  NECK:  Cervical lymph nodes: unremarkable, no lymphadenopathy.  ROM: normal.  Thyroid: no thyromegaly, non tender.  LUNGS:  Breath sounds: clear to auscultation.  Dyspnea: no.  HEART:  Murmurs: none.  Rate: normal.  Rhythm: regular.  ABDOMEN:  General: no masses,tenderness,organomegaly, no CVAT.  FEMALE GENITOURINARY:   Adnexa: no mass, non tender.  Anus/perineum: normal, no lesions.  Cervix/ cuff: normal appearance , no lesions/discharge/bleeding, good pelvic support.  External genitalia: normal, no lesions, no skin discoloration, no lymphadenopathy.  Rectum: deferred.  Urethra: normal external meatus.  Uterus: normal size/shape/consistency, freely mobile, non tender.  Vagina: pink/moist mucosa, no lesions, no abnormal discharge, odorless.  Vulva: normal, no lesions, no skin discoloration, non tender.  EXTREMITIES:  Extremities FROM of all extremities.  NEUROLOGICAL:  Gait: normal.  Orientation: alert and oriented x 3.     Assessments   1. Irregular menses - 626.4 (Primary)   2. 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 Irregular menses  Notes: Reviewed in detail the recommended surgical procedures-hysteroscopy, resection of fibroid with Trueclear and HTA endometrial ablation. Pt informed that bleeding may resume and that most definitive option is hysterectomy. R/B/A reviewed. All questions answered.  Referral To:[facility] Gynecology Reason:Precert and schedule for HTA ablation, True Clear resection of fibroid    Follow Up  2 Weeks post op

## 2012-09-16 NOTE — Brief Op Note (Signed)
09/16/2012  9:07 AM  PATIENT:  [name]  38 y.o. female  PRE-OPERATIVE DIAGNOSIS:  Irregular Menses, submucosal fibroid  POST-OPERATIVE DIAGNOSIS:  Same, submucosal fibroid not visualized hysterscopically  PROCEDURE:  Procedure(s): DILATATION & CURETTAGE/HYSTEROSCOPY WITH TRUCLEAR (N/A) DILATATION & CURETTAGE/HYSTEROSCOPY WITH HYDROTHERMAL ABLATION (N/A)  SURGEON:  Surgeon(s) and Role:    * [facility]ins, MD - Primary  PHYSICIAN ASSISTANT: None  ASSISTANTS: none   ANESTHESIA:   general  EBL:  Total I/O In: 1000 [I.V.:1000] Out: 300 [Urine:300] HTA deficit 0, Trueclear deficit 120  BLOOD ADMINISTERED:none  DRAINS: none   LOCAL MEDICATIONS USED:  MARCAINE 0.25% without epinephrine 10 cc  SPECIMEN:  Source of Specimen:  Endometrial currettings  DISPOSITION OF SPECIMEN:  PATHOLOGY  COUNTS:  YES  TOURNIQUET:  * No tourniquets in log *  DICTATION: .Other Dictation: Dictation Number [id number]  PLAN OF CARE: Discharge to home after PACU  PATIENT DISPOSITION:  PACU - hemodynamically stable.   Delay start of Pharmacological VTE agent (>24hrs) due to surgical blood loss or risk of bleeding: not applicable

## 2012-09-16 NOTE — Transfer of Care (Signed)
Immediate Anesthesia Transfer of Care Note  Patient: Diamond Nickel  Procedure(s) Performed: Procedure(s): DILATATION & CURETTAGE/HYSTEROSCOPY WITH TRUCLEAR (N/A) DILATATION & CURETTAGE/HYSTEROSCOPY WITH HYDROTHERMAL ABLATION (N/A)  Patient Location: PACU  Anesthesia Type:General  Level of Consciousness: awake, alert  and patient cooperative  Airway & Oxygen Therapy: Patient Spontanous Breathing and Patient connected to nasal cannula oxygen  Post-op Assessment: Report given to PACU RN and Post -op Vital signs reviewed and stable  Post vital signs: stable  Complications: No apparent anesthesia complications

## 2012-09-16 NOTE — Anesthesia Postprocedure Evaluation (Signed)
  Anesthesia Post-op Note  Anesthesia Post Note  Patient: [name]  Procedure(s) Performed: Procedure(s) (LRB): DILATATION & CURETTAGE/HYSTEROSCOPY WITH TRUCLEAR (N/A) DILATATION & CURETTAGE/HYSTEROSCOPY WITH HYDROTHERMAL ABLATION (N/A)  Anesthesia type: General  Patient location: PACU  Post pain: Pain level controlled  Post assessment: Post-op Vital signs reviewed  Last Vitals:  Filed Vitals:   09/16/12 1000  BP: 142/85  Pulse: 65  Temp: 36.7 C  Resp: 13    Post vital signs: Reviewed  Level of consciousness: sedated  Complications: No apparent anesthesia complications

## 2012-09-16 NOTE — Anesthesia Preprocedure Evaluation (Addendum)
Anesthesia Evaluation  Patient identified by MRN, date of birth, ID band Patient awake    Reviewed: Allergy & Precautions, H&P , Patient's Chart, lab work & pertinent test results, reviewed documented beta blocker date and time   Airway Mallampati: III TM Distance: >3 FB Neck ROM: full    Dental no notable dental hx.    Pulmonary  breath sounds clear to auscultation  Pulmonary exam normal       Cardiovascular hypertension, On Medications Rhythm:regular Rate:Normal     Neuro/Psych PSYCHIATRIC DISORDERS    GI/Hepatic GERD-  Medicated,  Endo/Other  Morbid obesity  Renal/GU      Musculoskeletal   Abdominal   Peds  Hematology   Anesthesia Other Findings   Reproductive/Obstetrics                          Anesthesia Physical Anesthesia Plan  ASA: III  Anesthesia Plan: General   Post-op Pain Management:    Induction: Intravenous  Airway Management Planned: LMA  Additional Equipment:   Intra-op Plan:   Post-operative Plan:   Informed Consent: I have reviewed the patients History and Physical, chart, labs and discussed the procedure including the risks, benefits and alternatives for the proposed anesthesia with the patient or authorized representative who has indicated his/her understanding and acceptance.   Dental Advisory Given  Plan Discussed with: CRNA and Surgeon  Anesthesia Plan Comments: (  Discussed  general anesthesia, including possible nausea, instrumentation of airway, sore throat,pulmonary aspiration, etc. I asked if the were any outstanding questions, or  concerns before we proceeded. )        Anesthesia Quick Evaluation

## 2012-09-16 NOTE — Interval H&P Note (Signed)
History and Physical Interval Note:  09/16/2012 7:26 AM  [name]  has presented today for surgery, with the diagnosis of Irregular Menses  The various methods of treatment have been discussed with the patient and family. After consideration of risks, benefits and other options for treatment, the patient has consented to  Procedure(s): DILATATION & CURETTAGE/HYSTEROSCOPY WITH TRUCLEAR (N/A) as a surgical intervention .  The patient's history has been reviewed, patient examined, no change in status, stable for surgery.  I have reviewed the patient's chart and labs.  Questions were answered to the patient's satisfaction.     Dion Body, [name]

## 2012-09-17 ENCOUNTER — Encounter (HOSPITAL_COMMUNITY): Payer: Self-pay | Admitting: Obstetrics and Gynecology

## 2012-09-17 NOTE — Op Note (Signed)
[name], [name]          ACCOUNT NO.:  [id number]  MEDICAL RECORD NO.:  [id number]  LOCATION:  [facility]                          FACILITY:  [facility]  PHYSICIAN:  Pieter Partridge, MD   DATE OF BIRTH:  10/13/1975  DATE OF PROCEDURE:  09/16/2012 DATE OF DISCHARGE:  09/16/2012                              OPERATIVE REPORT   PREOPERATIVE DIAGNOSIS:  Irregular menses and submucosal fibroid.  POSTOPERATIVE DIAGNOSIS:  Irregular menses and submucosal fibroid. Submucosal fibroid not visualized hysteroscopically.  PROCEDURE:  Endometrial ablation via hydrothermal ablation and hysteroscopy followed by TruClear curettage with hysteroscopy.  SURGEON:  Pieter Partridge, MD  ASSISTANT:  None.  ANESTHESIA:  General.  ESTIMATED BLOOD LOSS:  Minimal.  URINE OUTPUT:  300 mL.  ACA Deficit was 0.  TruClear deficit was 120.  BLOOD ADMINISTERED:  None.  DRAINS:  None.  LOCAL MEDICATIONS:  0.25% Marcaine without epinephrine.  SPECIMEN:  Endometrial curettings.  DISPOSITION:  To Pathology.  To PACU, hemodynamically stable.  COMPLICATIONS:  None.  FINDINGS:  Small uterus, normal endometrial cavity.  The submucosal fibroid was not completely visualized.  After they had the thermal ablation, thickening near the right ostia was noted.  Normal cervix.  PROCEDURE IN DETAIL:  [name] was identified in the holding area. She was then taken to the operating room with IV running.  She was then placed in the dorsal supine position and underwent general endotracheal anesthesia without complication.  She underwent LMA anesthesia without complication.  She was then placed in the dorsal lithotomy position and prepped and draped in normal sterile fashion.  I and O catheterization of the bladder was performed.  Graves speculum was inserted in the vagina.  The cervix was visualized. Approximately 2 mL of 0.25% Marcaine injected in the anterior lip of the cervix and then grasped with a  single-tooth tenaculum.  Paracervical block was performed.  A total of 2 mL was administered.  The cervix was easily dilated initially up to 19.  The ACA device was then calibrated in the usual fashion.  The ACA sheath was then advanced through the uterine cavity, and the findings above were noted.  The ACA ablation was then activated and was performed without any complications or any delays in the procedure.  The deficit of fluid loss was zero.  After the ablation and cool down, the endometrial cavity was explored in an attempt to identify the submucosal fibroids.  On the ultrasound, the fiber was supposed to be posterior, and there was a little area on the right ostia, more anterior fundal that appeared to be on the fibroid.  The ACA device was then removed, and the TruClear device was then primed, and the fiber ablate was advanced after the patient was dilated up to a 29 easily.  The sheath was advanced and then the actual TruClear device.  I attempted to morcellate the area but because it had originally been ablated, a completely broke down by gently touching it with the TruClear device.  The tissue seemed to be variant, had disintegrated due to the hydrothermal ablation.  A curettage was performed of the quadrants in the areas just to get a tissue specimen. The area that I thought  was a fibroid, completely disintegrated, but an adequate tissue sample was collected and sent to Pathology.  All instruments were removed from the uterus and the single-tooth tenaculum was then removed and the tenaculum site was hemostatic.  The Graves speculum was also removed.  All instrument and sponge counts were correct x3.  The patient received Ancef 2 g IV prior to procedure.  She had SCDs on that were operating throughout the entire procedure.  She tolerated the procedure well.     Pieter Partridge, MD     EBV/MEDQ  D:  09/16/2012  T:  09/17/2012  Job:  [id number]

## 2013-05-19 ENCOUNTER — Ambulatory Visit
Admission: RE | Admit: 2013-05-19 | Discharge: 2013-05-19 | Disposition: A | Discharge status: Home / Self Care | Payer: BC Managed Care – PPO | Source: Ambulatory Visit | Attending: Family Medicine | Admitting: Family Medicine

## 2013-05-19 ENCOUNTER — Other Ambulatory Visit: Payer: Self-pay | Admitting: Family Medicine

## 2013-05-19 DIAGNOSIS — M542 Cervicalgia: Secondary | ICD-10-CM

## 2013-05-19 DIAGNOSIS — M545 Low back pain, unspecified: Secondary | ICD-10-CM

## 2013-07-03 ENCOUNTER — Other Ambulatory Visit: Payer: Self-pay | Admitting: Family Medicine

## 2013-07-03 DIAGNOSIS — R748 Abnormal levels of other serum enzymes: Secondary | ICD-10-CM

## 2013-07-10 ENCOUNTER — Other Ambulatory Visit: Payer: BC Managed Care – PPO

## 2013-08-03 ENCOUNTER — Other Ambulatory Visit: Payer: BC Managed Care – PPO

## 2013-12-14 ENCOUNTER — Other Ambulatory Visit: Payer: Self-pay | Admitting: Obstetrics and Gynecology

## 2013-12-14 DIAGNOSIS — R1030 Lower abdominal pain, unspecified: Secondary | ICD-10-CM

## 2013-12-21 ENCOUNTER — Other Ambulatory Visit: Payer: BC Managed Care – PPO
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
